# Patient Record
Sex: Male | Born: 1970 | Race: White | Hispanic: No | Marital: Single | Smoking: Never smoker
Health system: Southern US, Community
[De-identification: ages and names within clinical notes are randomized; demographics above are authoritative.]

---

## 2020-07-12 ENCOUNTER — Other Ambulatory Visit: Payer: Self-pay

## 2020-07-12 ENCOUNTER — Encounter (HOSPITAL_BASED_OUTPATIENT_CLINIC_OR_DEPARTMENT_OTHER): Payer: Self-pay | Admitting: Emergency Medicine

## 2020-07-12 ENCOUNTER — Emergency Department (HOSPITAL_BASED_OUTPATIENT_CLINIC_OR_DEPARTMENT_OTHER)
Admission: EM | Admit: 2020-07-12 | Discharge: 2020-07-12 | Disposition: A | Discharge status: Home / Self Care | Payer: Self-pay | Attending: Emergency Medicine | Admitting: Emergency Medicine

## 2020-07-12 DIAGNOSIS — Z5321 Procedure and treatment not carried out due to patient leaving prior to being seen by health care provider: Secondary | ICD-10-CM | POA: Insufficient documentation

## 2020-07-12 DIAGNOSIS — L02212 Cutaneous abscess of back [any part, except buttock]: Secondary | ICD-10-CM | POA: Insufficient documentation

## 2020-07-12 MED ORDER — ACETAMINOPHEN 325 MG PO TABS
650.0000 mg | ORAL_TABLET | Freq: Once | ORAL | Status: AC
  Administered 2020-07-12: 650 mg via ORAL
  Filled 2020-07-12: qty 2

## 2020-07-12 NOTE — ED Triage Notes (Signed)
Abscess to Right upper back.  Reports its been there for a few days but worse the last two days.  Noted to swollen with purulent drainage.

## 2020-07-13 ENCOUNTER — Emergency Department (HOSPITAL_BASED_OUTPATIENT_CLINIC_OR_DEPARTMENT_OTHER)
Admission: EM | Admit: 2020-07-13 | Discharge: 2020-07-13 | Disposition: A | Discharge status: Home / Self Care | Payer: Self-pay | Attending: Emergency Medicine | Admitting: Emergency Medicine

## 2020-07-13 ENCOUNTER — Encounter (HOSPITAL_BASED_OUTPATIENT_CLINIC_OR_DEPARTMENT_OTHER): Payer: Self-pay | Admitting: *Deleted

## 2020-07-13 ENCOUNTER — Other Ambulatory Visit: Payer: Self-pay

## 2020-07-13 DIAGNOSIS — L02212 Cutaneous abscess of back [any part, except buttock]: Secondary | ICD-10-CM | POA: Insufficient documentation

## 2020-07-13 DIAGNOSIS — L03312 Cellulitis of back [any part except buttock]: Secondary | ICD-10-CM | POA: Insufficient documentation

## 2020-07-13 DIAGNOSIS — L02219 Cutaneous abscess of trunk, unspecified: Secondary | ICD-10-CM

## 2020-07-13 MED ORDER — ACETAMINOPHEN 500 MG PO TABS
1000.0000 mg | ORAL_TABLET | Freq: Once | ORAL | Status: AC
  Administered 2020-07-13: 1000 mg via ORAL
  Filled 2020-07-13: qty 2

## 2020-07-13 MED ORDER — DOXYCYCLINE HYCLATE 100 MG PO CAPS: 100.0000 mg | ORAL_CAPSULE | Freq: Two times a day (BID) | ORAL | 0 refills | Status: DC

## 2020-07-13 MED ORDER — MORPHINE SULFATE 15 MG PO TABS: 7.5000 mg | ORAL_TABLET | ORAL | 0 refills | Status: AC | PRN

## 2020-07-13 MED ORDER — KETOROLAC TROMETHAMINE 60 MG/2ML IM SOLN
15.0000 mg | Freq: Once | INTRAMUSCULAR | Status: AC
  Administered 2020-07-13: 15 mg via INTRAMUSCULAR
  Filled 2020-07-13: qty 2

## 2020-07-13 MED ORDER — OXYCODONE HCL 5 MG PO TABS
5.0000 mg | ORAL_TABLET | Freq: Once | ORAL | Status: AC
  Administered 2020-07-13: 5 mg via ORAL
  Filled 2020-07-13: qty 1

## 2020-07-13 MED ORDER — DOXYCYCLINE HYCLATE 100 MG PO TABS
100.0000 mg | ORAL_TABLET | Freq: Once | ORAL | Status: AC
  Administered 2020-07-13: 100 mg via ORAL
  Filled 2020-07-13: qty 1

## 2020-07-13 NOTE — Discharge Instructions (Signed)
Use warm compresses at least 4 times a day.  Please return for rapid spreading redness or if you develop a fever.  Follow-up with the general surgeons in the office.  Please discuss this with the family doctor so they can keep track of how you are doing.

## 2020-07-13 NOTE — ED Triage Notes (Signed)
Pt has abscess to upper back for over a week. Family has attempted to drain area without much success. Area is reddened, painful, and hard to touch

## 2020-07-14 NOTE — ED Provider Notes (Addendum)
[facility] EMERGENCY DEPARTMENT Provider Note   CSN: [id number] Arrival date & time: 07/13/20  1927     History Chief Complaint  Patient presents with  . Abscess    Brandon Duran is a 49 y.o. male.  49 yo M with a cc of an abscess to his trunk.  Patient has had this area on his back for a couple weeks.  Has had multiple family members try to open it without improvement.  No fevers or chills.  The history is provided by the patient.  Abscess Location:  Torso Torso abscess location:  Upper back Red streaking: no   Duration:  2 days Progression:  Worsening Chronicity:  New Relieved by:  Nothing Worsened by:  Nothing Ineffective treatments:  None tried Associated symptoms: no fever, no headaches and no vomiting        History reviewed. No pertinent past medical history.  There are no problems to display for this patient.   History reviewed. No pertinent surgical history.     No family history on file.  Social History   Tobacco Use  . Smoking status: Never Smoker  . Smokeless tobacco: Never Used  Substance Use Topics  . Alcohol use: Never    Comment: last drink 2007  . Drug use: Yes    Types: Marijuana    Home Medications Prior to Admission medications   Medication Sig Start Date End Date Taking? Authorizing Provider  doxycycline (VIBRAMYCIN) 100 MG capsule Take 1 capsule (100 mg total) by mouth 2 (two) times daily. One po bid x 7 days 07/13/20   Melene Plan, DO  morphine (MSIR) 15 MG tablet Take 0.5 tablets (7.5 mg total) by mouth every 4 (four) hours as needed for severe pain. 07/13/20   Melene Plan, DO    Allergies    Patient has no known allergies.  Review of Systems   Review of Systems  Constitutional: Negative for chills and fever.  HENT: Negative for congestion and facial swelling.   Eyes: Negative for discharge and visual disturbance.  Respiratory: Negative for shortness of breath.   Cardiovascular: Negative for chest pain and  palpitations.  Gastrointestinal: Negative for abdominal pain, diarrhea and vomiting.  Musculoskeletal: Negative for arthralgias and myalgias.  Skin: Positive for wound. Negative for color change and rash.  Neurological: Negative for tremors, syncope and headaches.  Psychiatric/Behavioral: Negative for confusion and dysphoric mood.    Physical Exam Updated Vital Signs BP (!) 128/103   Pulse (!) 105   Temp 98.1 F (36.7 C) (Oral)   Resp 20   Ht 5\' 8"  (1.727 m)   Wt 74.8 kg   SpO2 99%   BMI 25.07 kg/m   Physical Exam Vitals and nursing note reviewed.  Constitutional:      Appearance: He is well-developed.  HENT:     Head: Normocephalic and atraumatic.  Eyes:     Pupils: Pupils are equal, round, and reactive to light.  Neck:     Vascular: No JVD.  Cardiovascular:     Rate and Rhythm: Normal rate and regular rhythm.     Heart sounds: No murmur heard.  No friction rub. No gallop.   Pulmonary:     Effort: No respiratory distress.     Breath sounds: No wheezing.  Abdominal:     General: There is no distension.     Tenderness: There is no guarding or rebound.  Musculoskeletal:        General: Tenderness present. Normal range of motion.  Cervical back: Normal range of motion and neck supple.     Comments: Large area of induration with some mild fluctuance.    Skin:    Coloration: Skin is not pale.     Findings: No rash.  Neurological:     Mental Status: He is alert and oriented to person, place, and time.  Psychiatric:        Behavior: Behavior normal.     ED Results / Procedures / Treatments   Labs (all labs ordered are listed, but only abnormal results are displayed) Labs Reviewed - No data to display  EKG None  Radiology No results found.  Procedures .Marland KitchenIncision and Drainage  Date/Time: 07/14/2020 12:11 AM Performed by: Melene Plan, DO Authorized by: Melene Plan, DO   Consent:    Consent obtained:  Verbal   Consent given by:  Patient   Risks  discussed:  Bleeding, incomplete drainage and infection   Alternatives discussed:  No treatment, delayed treatment and alternative treatment Location:    Type:  Abscess   Location:  Trunk   Trunk location:  Back Pre-procedure details:    Skin preparation:  Chloraprep Anesthesia (see MAR for exact dosages):    Anesthesia method:  Local infiltration   Local anesthetic:  Lidocaine 2% WITH epi Procedure type:    Complexity:  Complex Procedure details:    Needle aspiration: no     Incision types:  Single straight   Incision depth:  Subcutaneous   Scalpel blade:  11   Wound management:  Probed and deloculated   Drainage:  Purulent and bloody   Drainage amount:  Moderate   Wound treatment:  Wound left open   Packing materials:  None Post-procedure details:    Patient tolerance of procedure:  Tolerated well, no immediate complications Comments:     No significant cavity was found.  Ulceration with granulation tissue was explored without any obvious tracking.   (including critical care time)  Medications Ordered in ED Medications  acetaminophen (TYLENOL) tablet 1,000 mg (1,000 mg Oral Given 07/13/20 2320)  ketorolac (TORADOL) injection 15 mg (15 mg Intramuscular Given 07/13/20 2319)  oxyCODONE (Oxy IR/ROXICODONE) immediate release tablet 5 mg (5 mg Oral Given 07/13/20 2320)  doxycycline (VIBRA-TABS) tablet 100 mg (100 mg Oral Given 07/13/20 2326)    ED Course  I have reviewed the triage vital signs and the nursing notes.  Pertinent labs & imaging results that were available during my care of the patient were reviewed by me and considered in my medical decision making (see chart for details).    MDM Rules/Calculators/A&P                          49 yo M with a cc of an abscess to his trunk.  Going on for the past couple weeks.  Has had multiple attempts at I&D at bedside by his family members.  I&D performed here.  Mostly indurated tissue but there is some fluctuance that was  removed.  With such a large area of induration will refer him to general surgery.  The patients results and plan were reviewed and discussed.   Any x-rays performed were independently reviewed by myself.   Differential diagnosis were considered with the presenting HPI.  Medications  acetaminophen (TYLENOL) tablet 1,000 mg (1,000 mg Oral Given 07/13/20 2320)  ketorolac (TORADOL) injection 15 mg (15 mg Intramuscular Given 07/13/20 2319)  oxyCODONE (Oxy IR/ROXICODONE) immediate release tablet 5 mg (5 mg Oral  Given 07/13/20 2320)  doxycycline (VIBRA-TABS) tablet 100 mg (100 mg Oral Given 07/13/20 2326)    Vitals:   07/13/20 1952 07/13/20 1954 07/13/20 2323  BP: 116/90  (!) 128/103  Pulse: (!) 103  (!) 105  Resp: 16  20  Temp: 98.1 F (36.7 C)    TempSrc: Oral    SpO2: 100%  99%  Weight:  74.8 kg   Height:  5\' 8"  (1.727 m)     Final diagnoses:  Cellulitis and abscess of trunk    Admission/ observation were discussed with the admitting physician, patient and/or family and they are comfortable with the plan.   Final Clinical Impression(s) / ED Diagnoses Final diagnoses:  Cellulitis and abscess of trunk    Rx / DC Orders ED Discharge Orders         Ordered    morphine (MSIR) 15 MG tablet  Every 4 hours PRN        07/13/20 2321    doxycycline (VIBRAMYCIN) 100 MG capsule  2 times daily        07/13/20 2321           07/15/20, DO 07/14/20 0011    07/16/20, DO 07/14/20 0012

## 2020-11-14 ENCOUNTER — Encounter (HOSPITAL_BASED_OUTPATIENT_CLINIC_OR_DEPARTMENT_OTHER): Payer: Self-pay | Admitting: Emergency Medicine

## 2020-11-14 ENCOUNTER — Emergency Department (HOSPITAL_BASED_OUTPATIENT_CLINIC_OR_DEPARTMENT_OTHER)
Admission: EM | Admit: 2020-11-14 | Discharge: 2020-11-14 | Disposition: A | Discharge status: Home / Self Care | Payer: Self-pay | Attending: Emergency Medicine | Admitting: Emergency Medicine

## 2020-11-14 ENCOUNTER — Other Ambulatory Visit: Payer: Self-pay

## 2020-11-14 DIAGNOSIS — R058 Other specified cough: Secondary | ICD-10-CM

## 2020-11-14 DIAGNOSIS — R519 Headache, unspecified: Secondary | ICD-10-CM | POA: Insufficient documentation

## 2020-11-14 DIAGNOSIS — R000 Tachycardia, unspecified: Secondary | ICD-10-CM | POA: Insufficient documentation

## 2020-11-14 DIAGNOSIS — L0211 Cutaneous abscess of neck: Secondary | ICD-10-CM | POA: Insufficient documentation

## 2020-11-14 DIAGNOSIS — Z20822 Contact with and (suspected) exposure to covid-19: Secondary | ICD-10-CM | POA: Insufficient documentation

## 2020-11-14 MED ORDER — DOXYCYCLINE HYCLATE 100 MG PO TABS
ORAL_TABLET | ORAL | Status: AC
  Filled 2020-11-14: qty 1

## 2020-11-14 MED ORDER — IBUPROFEN 400 MG PO TABS
600.0000 mg | ORAL_TABLET | Freq: Once | ORAL | Status: AC
  Administered 2020-11-14: 600 mg via ORAL
  Filled 2020-11-14: qty 1

## 2020-11-14 MED ORDER — DOXYCYCLINE HYCLATE 100 MG PO CAPS: 100.0000 mg | ORAL_CAPSULE | Freq: Two times a day (BID) | ORAL | 0 refills | Status: AC

## 2020-11-14 MED ORDER — LIDOCAINE-EPINEPHRINE (PF) 2 %-1:200000 IJ SOLN
5.0000 mL | Freq: Once | INTRAMUSCULAR | Status: AC
  Administered 2020-11-14: 5 mL
  Filled 2020-11-14: qty 20

## 2020-11-14 MED ORDER — DOXYCYCLINE HYCLATE 100 MG PO TABS
100.0000 mg | ORAL_TABLET | Freq: Once | ORAL | Status: AC
  Administered 2020-11-14: 100 mg via ORAL

## 2020-11-14 NOTE — Discharge Instructions (Addendum)
Abscess Care instructions Keep your wound clean and covered. Soak/flush your wound with warm water, multiple times per day. You can take Advil/ibuprofen every 6 hours as needed for pain. Take the antibiotic, Doxycycline, every 12 hours until gone. Follow up with your primary care or back here for wound recheck in 2 days.  Return to the ER for worsening symptoms of illness, fever, worsening redness, or new or worsening symptoms.  COVID Care instructions: You can alternate Tylenol/acetaminophen and Advil/ibuprofen/Motrin every 4 hours for sore throat, body aches, headache or fever.  Drink plenty of water.  Use saline nasal spray for congestion. Wash your hands frequently. You have a COVID test pending. Please isolate at home while awaiting your results.  You can follow your results on MyChart. > If your test is negative, stay home until your fever has resolved/your symptoms are improving. > If your test is positive, isolate at home for at least 7 days after the day your symptoms initially began, and THEN at least 24 hours after you are fever-free without the help of medications AND your symptoms are improving. Only once your symptoms are improving and you are fever-free can you come out out of quarantine. Once, then you should wear a mask in public for another 5 days. Follow up with your primary care provider. Return to the ER for significant shortness of breath, uncontrollable vomiting, severe chest pain, or other concerning symptoms.

## 2020-11-14 NOTE — ED Triage Notes (Signed)
Obvious skin infection posterior right ear , recurrent issues.

## 2020-11-14 NOTE — ED Provider Notes (Signed)
[facility] EMERGENCY DEPARTMENT Provider Note   CSN: [id number] Arrival date & time: 11/14/20  1033     History Chief Complaint  Patient presents with  . Abscess    neck    Brandon Duran is a 50 y.o. male presenting to the emergency department with complaint of skin abscess to the right posterior neck.  Patient states initially it started as a pimple about 2 weeks ago.  He popped it has gradually enlarged in size.  He states he has been able to express purulent fluid out of it.  It is very tender now.  No known history of MRSA reports he frequently has been developing abscesses that initially started out as pimples.  He does admit to popping them whenever he notices them. Per review of medical record, patient was seen in October for abscess to the back.  He was provided with general surgery referral, however he states he was unaware of this and did not follow-up.  He endorses about 3 days ago he began having symptoms of COVID with known exposure.  He lives with his father-in-law who was recently admitted for Covid.  He endorses URI symptoms, body aches, chills, headache.  No expressed concerns regarding his Covid symptoms.  The history is provided by the patient and medical records.       History reviewed. No pertinent past medical history.  There are no problems to display for this patient.   History reviewed. No pertinent surgical history.     No family history on file.  Social History   Tobacco Use  . Smoking status: Never Smoker  . Smokeless tobacco: Never Used  Substance Use Topics  . Alcohol use: Never    Comment: last drink 2007  . Drug use: Yes    Types: Marijuana    Home Medications Prior to Admission medications   Medication Sig Start Date End Date Taking? Authorizing Provider  doxycycline (VIBRAMYCIN) 100 MG capsule Take 1 capsule (100 mg total) by mouth 2 (two) times daily. One po bid x 7 days 11/14/20   Robinson, [place] N, PA-C  morphine  (MSIR) 15 MG tablet Take 0.5 tablets (7.5 mg total) by mouth every 4 (four) hours as needed for severe pain. 07/13/20   Melene Plan, DO    Allergies    Patient has no known allergies.  Review of Systems   Review of Systems  Constitutional: Positive for chills.  Respiratory: Positive for cough.   Musculoskeletal: Positive for myalgias.  Skin: Positive for color change and wound.  Neurological: Positive for headaches.  All other systems reviewed and are negative.   Physical Exam Updated Vital Signs BP (!) 143/94   Pulse (!) 102   Temp 98.1 F (36.7 C) (Oral)   Resp 18   Ht 5\' 8"  (1.727 m)   Wt 76.2 kg   SpO2 100%   BMI 25.54 kg/m   Physical Exam Vitals and nursing note reviewed.  Constitutional:      General: He is not in acute distress.    Appearance: He is well-developed and well-nourished. He is not ill-appearing.  HENT:     Head: Normocephalic and atraumatic.  Eyes:     Conjunctiva/sclera: Conjunctivae normal.  Cardiovascular:     Rate and Rhythm: Regular rhythm. Tachycardia present.  Pulmonary:     Effort: Pulmonary effort is normal. No respiratory distress.  Abdominal:     Palpations: Abdomen is soft.  Musculoskeletal:     Cervical back: Normal range of motion.  Skin:    General: Skin is warm.     Comments: Right posterior lateral aspect of the neck just below the hairline with large 4 x 3 cm abscess with purulence.  There is quite a bit of localized induration and tenderness, fluctuance is noted centrally.  See image below. Ranging neck without difficulty.   Neurological:     Mental Status: He is alert.  Psychiatric:        Mood and Affect: Mood and affect normal.        Behavior: Behavior normal.       ED Results / Procedures / Treatments   Labs (all labs ordered are listed, but only abnormal results are displayed) Labs Reviewed  SARS CORONAVIRUS 2 (TAT 6-24 HRS)    EKG None  Radiology No results found.  Procedures .Marland KitchenIncision and  Drainage  Date/Time: 11/14/2020 2:20 PM Performed by: Robinson, [place] N, PA-C Authorized by: Robinson, [place] N, PA-C   Consent:    Consent obtained:  Verbal   Consent given by:  Patient   Risks discussed:  Pain, damage to other organs and bleeding Location:    Type:  Abscess   Size:  4x3cm   Location:  Neck   Neck location:  R posterior Pre-procedure details:    Skin preparation:  Povidone-iodine Anesthesia:    Anesthesia method:  Local infiltration   Local anesthetic:  Lidocaine 2% WITH epi Procedure type:    Complexity:  Simple Procedure details:    Incision types:  Single straight   Incision depth:  Subcutaneous   Wound management:  Probed and deloculated and irrigated with saline   Drainage:  Bloody and purulent   Drainage amount: small to moderate.   Wound treatment:  Wound left open   Packing materials:  None Post-procedure details:    Procedure completion:  Tolerated well, no immediate complications Comments:     No large cavity present after drainage     Medications Ordered in ED Medications  doxycycline (VIBRA-TABS) tablet 100 mg (has no administration in time range)  lidocaine-EPINEPHrine (XYLOCAINE W/EPI) 2 %-1:200000 (PF) injection 5 mL (5 mLs Infiltration Given by Other 11/14/20 1243)  ibuprofen (ADVIL) tablet 600 mg (600 mg Oral Given 11/14/20 1242)    ED Course  I have reviewed the triage vital signs and the nursing notes.  Pertinent labs & imaging results that were available during my care of the patient were reviewed by me and considered in my medical decision making (see chart for details).    MDM Rules/Calculators/A&P                          Patient with skin abscess  located postero-lateral right neck, amenable to incision and drainage. No large cavity after drainage to warrant packing or drain,  wound recheck in 2 days. He has more localized induration, no large surrounding cellulitis. However considering extent of induration, will prescribe  doxycycline.  Encouraged home warm soaks and flushing.    He is also reporting positive exposure to the COVID 19 virus with assoc URI sx, body aches, headache, chills. He is afebrile here. Considered systemic symptoms in associated with abscess, however body aches and chills started with URI sx. Seems more likely related to viral illness, though he is instructed of strict return precautions. Abx prescribed to cover skin and MRSA. COVID test sent.  Brandon Duran was evaluated in Emergency Department on 11/14/2020 for the symptoms described in the history of present illness.  He was evaluated in the context of the global COVID-19 pandemic, which necessitated consideration that the patient might be at risk for infection with the SARS-CoV-2 virus that causes COVID-19. Institutional protocols and algorithms that pertain to the evaluation of patients at risk for COVID-19 are in a state of rapid change based on information released by regulatory bodies including the CDC and federal and state organizations. These policies and algorithms were followed during the patient's care in the ED.  Discussed results, findings, treatment and follow up. Patient advised of return precautions. Patient verbalized understanding and agreed with plan.  Final Clinical Impression(s) / ED Diagnoses Final diagnoses:  Abscess of neck  Cough with exposure to COVID-19 virus    Rx / DC Orders ED Discharge Orders         Ordered    doxycycline (VIBRAMYCIN) 100 MG capsule  2 times daily        11/14/20 1416           Robinson, [place] N, [place] 11/14/20 1428    Derwood Kaplan, MD 11/14/20 1517

## 2020-11-14 NOTE — ED Notes (Signed)
Pt states he was exposed to covid within 2 weeks, wants to be tested, states he had a headache for 3 days and didn't feel good, however is better today.

## 2020-11-15 LAB — SARS CORONAVIRUS 2 (TAT 6-24 HRS): SARS Coronavirus 2: NEGATIVE

## 2020-12-15 ENCOUNTER — Emergency Department (HOSPITAL_COMMUNITY): Payer: No Typology Code available for payment source

## 2020-12-15 ENCOUNTER — Emergency Department (HOSPITAL_COMMUNITY)
Admission: EM | Admit: 2020-12-15 | Discharge: 2020-12-15 | Disposition: A | Discharge status: Home / Self Care | Payer: No Typology Code available for payment source | Attending: Emergency Medicine | Admitting: Emergency Medicine

## 2020-12-15 ENCOUNTER — Encounter (HOSPITAL_COMMUNITY): Payer: Self-pay | Admitting: *Deleted

## 2020-12-15 DIAGNOSIS — T1490XA Injury, unspecified, initial encounter: Secondary | ICD-10-CM

## 2020-12-15 DIAGNOSIS — T07XXXA Unspecified multiple injuries, initial encounter: Secondary | ICD-10-CM

## 2020-12-15 DIAGNOSIS — S0990XA Unspecified injury of head, initial encounter: Secondary | ICD-10-CM | POA: Insufficient documentation

## 2020-12-15 DIAGNOSIS — S50312A Abrasion of left elbow, initial encounter: Secondary | ICD-10-CM | POA: Diagnosis not present

## 2020-12-15 DIAGNOSIS — S20311A Abrasion of right front wall of thorax, initial encounter: Secondary | ICD-10-CM | POA: Insufficient documentation

## 2020-12-15 DIAGNOSIS — Z20822 Contact with and (suspected) exposure to covid-19: Secondary | ICD-10-CM | POA: Insufficient documentation

## 2020-12-15 DIAGNOSIS — Z23 Encounter for immunization: Secondary | ICD-10-CM | POA: Diagnosis not present

## 2020-12-15 DIAGNOSIS — S199XXA Unspecified injury of neck, initial encounter: Secondary | ICD-10-CM | POA: Diagnosis not present

## 2020-12-15 DIAGNOSIS — Y92410 Unspecified street and highway as the place of occurrence of the external cause: Secondary | ICD-10-CM | POA: Insufficient documentation

## 2020-12-15 DIAGNOSIS — S30811A Abrasion of abdominal wall, initial encounter: Secondary | ICD-10-CM | POA: Insufficient documentation

## 2020-12-15 DIAGNOSIS — S80212A Abrasion, left knee, initial encounter: Secondary | ICD-10-CM | POA: Insufficient documentation

## 2020-12-15 DIAGNOSIS — S299XXA Unspecified injury of thorax, initial encounter: Secondary | ICD-10-CM | POA: Diagnosis present

## 2020-12-15 LAB — RESP PANEL BY RT-PCR (FLU A&B, COVID) ARPGX2
Influenza A by PCR: NEGATIVE
Influenza B by PCR: NEGATIVE
SARS Coronavirus 2 by RT PCR: NEGATIVE

## 2020-12-15 LAB — I-STAT CHEM 8, ED
BUN: 15 mg/dL (ref 6–20)
Calcium, Ion: 1.17 mmol/L (ref 1.15–1.40)
Chloride: 101 mmol/L (ref 98–111)
Creatinine, Ser: 1.1 mg/dL (ref 0.61–1.24)
Glucose, Bld: 114 mg/dL — ABNORMAL HIGH (ref 70–99)
HCT: 44 % (ref 39.0–52.0)
Hemoglobin: 15 g/dL (ref 13.0–17.0)
Potassium: 3.5 mmol/L (ref 3.5–5.1)
Sodium: 139 mmol/L (ref 135–145)
TCO2: 27 mmol/L (ref 22–32)

## 2020-12-15 LAB — CBC
HCT: 42.9 % (ref 39.0–52.0)
Hemoglobin: 14.3 g/dL (ref 13.0–17.0)
MCH: 28.7 pg (ref 26.0–34.0)
MCHC: 33.3 g/dL (ref 30.0–36.0)
MCV: 86.1 fL (ref 80.0–100.0)
Platelets: 307 10*3/uL (ref 150–400)
RBC: 4.98 MIL/uL (ref 4.22–5.81)
RDW: 12.5 % (ref 11.5–15.5)
WBC: 8.9 10*3/uL (ref 4.0–10.5)
nRBC: 0 % (ref 0.0–0.2)

## 2020-12-15 LAB — COMPREHENSIVE METABOLIC PANEL
ALT: 27 U/L (ref 0–44)
AST: 21 U/L (ref 15–41)
Albumin: 3.9 g/dL (ref 3.5–5.0)
Alkaline Phosphatase: 66 U/L (ref 38–126)
Anion gap: 10 (ref 5–15)
BUN: 13 mg/dL (ref 6–20)
CO2: 26 mmol/L (ref 22–32)
Calcium: 9.4 mg/dL (ref 8.9–10.3)
Chloride: 100 mmol/L (ref 98–111)
Creatinine, Ser: 1.18 mg/dL (ref 0.61–1.24)
GFR, Estimated: >60 mL/min (ref >60)
Glucose, Bld: 115 mg/dL — ABNORMAL HIGH (ref 70–99)
Potassium: 3.6 mmol/L (ref 3.5–5.1)
Sodium: 136 mmol/L (ref 135–145)
Total Bilirubin: 0.7 mg/dL (ref 0.3–1.2)
Total Protein: 7 g/dL (ref 6.5–8.1)

## 2020-12-15 LAB — LACTIC ACID, PLASMA: Lactic Acid, Venous: 1.2 mmol/L (ref 0.5–1.9)

## 2020-12-15 LAB — PROTIME-INR
INR: 1 (ref 0.8–1.2)
Prothrombin Time: 12.3 seconds (ref 11.4–15.2)

## 2020-12-15 LAB — ETHANOL: Alcohol, Ethyl (B): <10 mg/dL (ref <10)

## 2020-12-15 MED ORDER — LACTATED RINGERS IV BOLUS
1000.0000 mL | Freq: Once | INTRAVENOUS | Status: AC
  Administered 2020-12-15: 1000 mL via INTRAVENOUS

## 2020-12-15 MED ORDER — SILVER SULFADIAZINE 1 % EX CREA
TOPICAL_CREAM | Freq: Once | CUTANEOUS | Status: AC
  Administered 2020-12-15: 1 via TOPICAL
  Filled 2020-12-15: qty 85

## 2020-12-15 MED ORDER — TETANUS-DIPHTH-ACELL PERTUSSIS 5-2.5-18.5 LF-MCG/0.5 IM SUSY
0.5000 mL | PREFILLED_SYRINGE | Freq: Once | INTRAMUSCULAR | Status: AC
  Administered 2020-12-15: 0.5 mL via INTRAMUSCULAR
  Filled 2020-12-15: qty 0.5

## 2020-12-15 MED ORDER — FENTANYL CITRATE (PF) 100 MCG/2ML IJ SOLN
50.0000 ug | INTRAMUSCULAR | Status: DC | PRN
  Administered 2020-12-15: 50 ug via INTRAVENOUS
  Filled 2020-12-15: qty 2

## 2020-12-15 MED ORDER — OXYCODONE-ACETAMINOPHEN 5-325 MG PO TABS: 1.0000 | ORAL_TABLET | ORAL | 0 refills | Status: AC | PRN

## 2020-12-15 MED ORDER — IOHEXOL 300 MG/ML  SOLN
100.0000 mL | Freq: Once | INTRAMUSCULAR | Status: AC | PRN
  Administered 2020-12-15: 100 mL via INTRAVENOUS

## 2020-12-15 NOTE — ED Notes (Signed)
X-ray at bedside at this time.

## 2020-12-15 NOTE — ED Notes (Signed)
C/O pain to left side. Limping on left lower extremity when ambulating.
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
## 2020-12-15 NOTE — ED Notes (Signed)
Patient transported to CT 

## 2020-12-15 NOTE — ED Notes (Signed)
Pt ambulated to restroom. 
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
## 2020-12-16 ENCOUNTER — Encounter (HOSPITAL_BASED_OUTPATIENT_CLINIC_OR_DEPARTMENT_OTHER): Payer: Self-pay | Admitting: Emergency Medicine

## 2020-12-16 LAB — SAMPLE TO BLOOD BANK
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
